# Patient Record
Sex: Male | Born: 1940 | Race: White | Hispanic: No | Marital: Married
Health system: Southern US, Community
[De-identification: ages and names within clinical notes are randomized; demographics above are authoritative.]

---

## 2001-08-31 ENCOUNTER — Ambulatory Visit (HOSPITAL_COMMUNITY): Admission: RE | Admit: 2001-08-31 | Discharge: 2001-08-31 | Payer: Self-pay | Admitting: Gastroenterology

## 2002-02-08 ENCOUNTER — Encounter: Payer: Self-pay | Admitting: *Deleted

## 2002-02-08 ENCOUNTER — Inpatient Hospital Stay (HOSPITAL_COMMUNITY): Admission: EM | Admit: 2002-02-08 | Discharge: 2002-02-10 | Payer: Self-pay | Admitting: *Deleted

## 2002-06-16 ENCOUNTER — Encounter: Payer: Self-pay | Admitting: Family Medicine

## 2002-06-16 ENCOUNTER — Encounter: Admission: RE | Admit: 2002-06-16 | Discharge: 2002-06-16 | Payer: Self-pay | Admitting: Family Medicine

## 2006-06-16 ENCOUNTER — Ambulatory Visit: Payer: Self-pay | Admitting: Gastroenterology

## 2006-07-02 ENCOUNTER — Ambulatory Visit: Payer: Self-pay | Admitting: Gastroenterology

## 2006-07-02 ENCOUNTER — Encounter (INDEPENDENT_AMBULATORY_CARE_PROVIDER_SITE_OTHER): Payer: Self-pay | Admitting: *Deleted

## 2006-10-06 ENCOUNTER — Encounter: Admission: RE | Admit: 2006-10-06 | Discharge: 2006-10-06 | Payer: Self-pay | Admitting: Family Medicine

## 2006-10-17 ENCOUNTER — Encounter: Admission: RE | Admit: 2006-10-17 | Discharge: 2006-10-17 | Payer: Self-pay | Admitting: Family Medicine

## 2006-12-23 ENCOUNTER — Encounter: Admission: RE | Admit: 2006-12-23 | Discharge: 2006-12-23 | Payer: Self-pay | Admitting: Family Medicine

## 2007-01-08 ENCOUNTER — Encounter: Admission: RE | Admit: 2007-01-08 | Discharge: 2007-01-08 | Payer: Self-pay | Admitting: Family Medicine

## 2007-01-29 ENCOUNTER — Inpatient Hospital Stay (HOSPITAL_COMMUNITY): Admission: RE | Admit: 2007-01-29 | Discharge: 2007-02-03 | Payer: Self-pay | Admitting: Neurosurgery

## 2007-01-29 ENCOUNTER — Ambulatory Visit: Payer: Self-pay | Admitting: Emergency Medicine

## 2007-03-02 ENCOUNTER — Ambulatory Visit (HOSPITAL_BASED_OUTPATIENT_CLINIC_OR_DEPARTMENT_OTHER): Admission: RE | Admit: 2007-03-02 | Discharge: 2007-03-02 | Payer: Self-pay | Admitting: Emergency Medicine

## 2007-03-10 ENCOUNTER — Ambulatory Visit: Payer: Self-pay | Admitting: Pulmonary Disease

## 2007-03-17 ENCOUNTER — Ambulatory Visit: Payer: Self-pay | Admitting: Pulmonary Disease

## 2007-09-15 IMAGING — CR DG ORBITS FOR FOREIGN BODY
1 series · 1 of 1 positions shown · non-contrast
Comparison: None.

ORBITS - 2 VIEW:

CLINICAL DATA: History of metal exposure. Patient for MRI

[view not recorded]
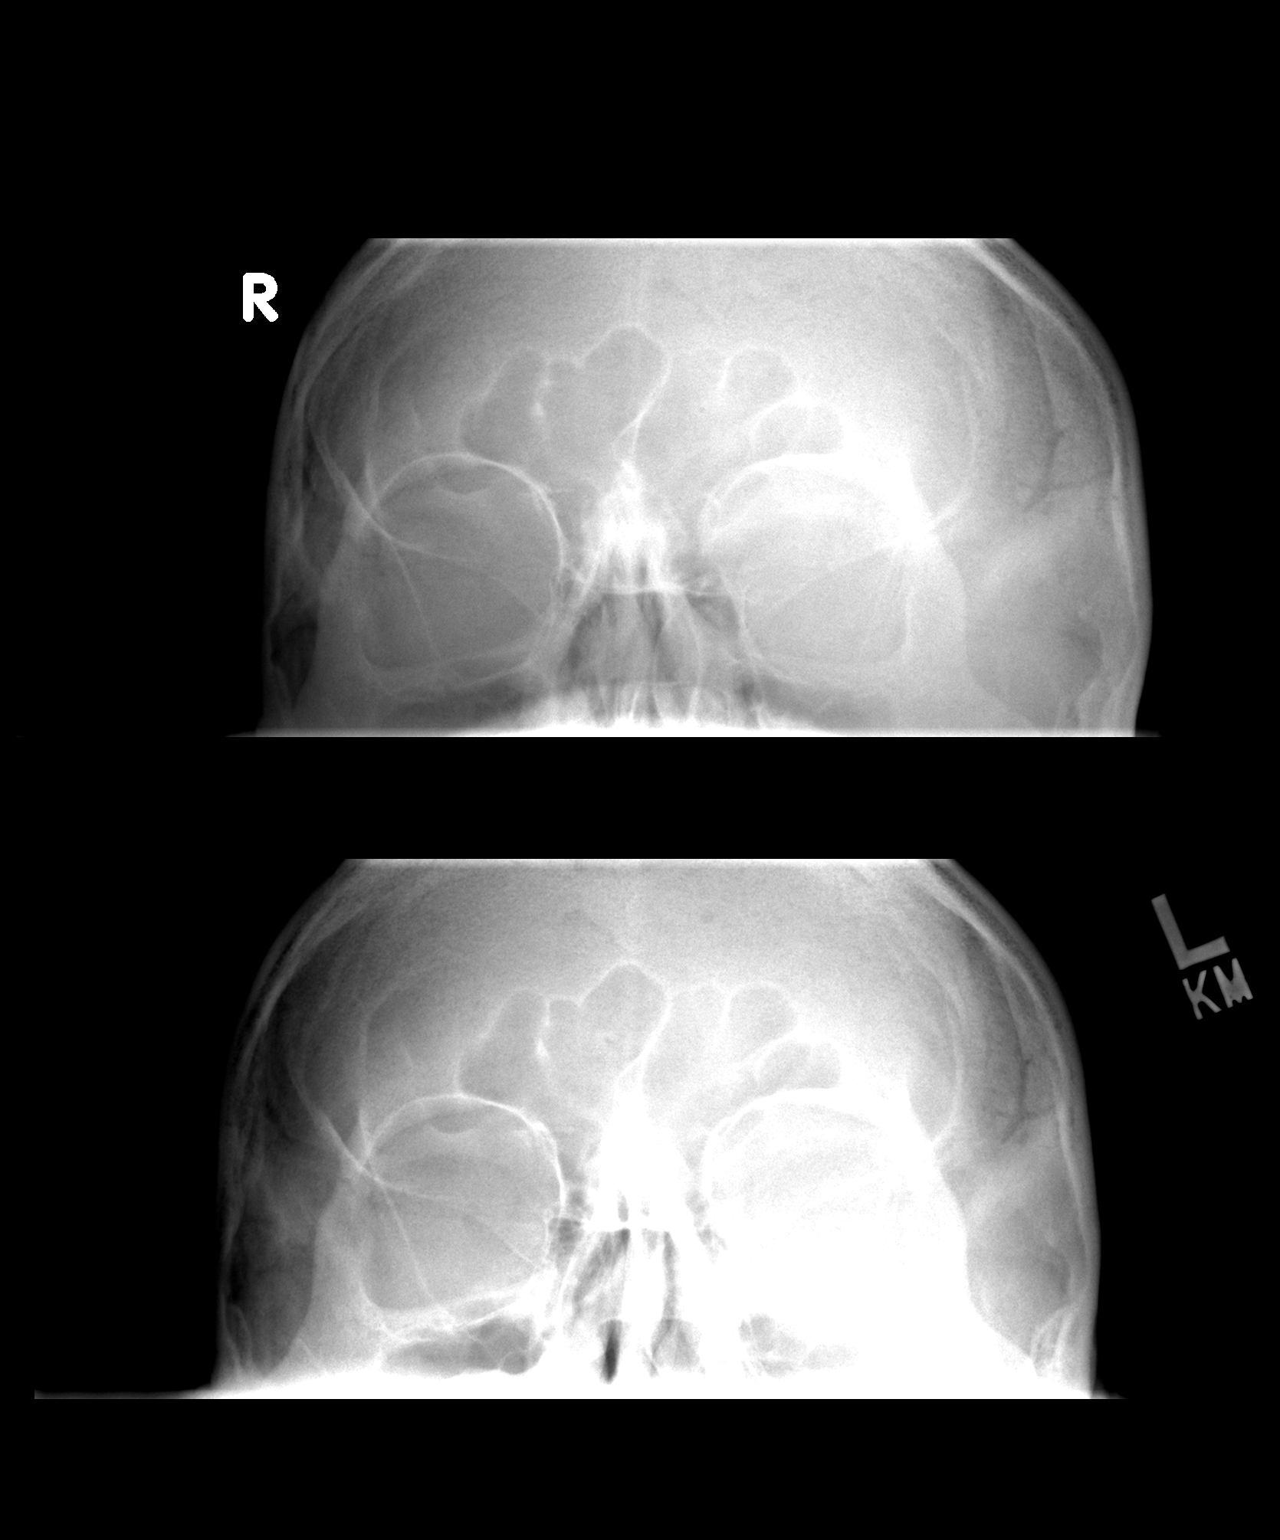

[1 of 1 positions shown; findings below may reference images not displayed]

FINDINGS: There is no evidence of metallic foreign body within the orbits. No
significant bone abnormality identified.
IMPRESSION: No evidence of metallic foreign body within the orbits.

## 2008-01-08 IMAGING — CR DG CERVICAL SPINE 2 OR 3 VIEWS
1 series · 1 of 1 positions shown · non-contrast
Comparison: none

CLINICAL DATA: Cervical kyphosis with C3-4 through C6-7 fusion. 
INTRAOPERATIVE LATERAL VIEWS OF THE CERVICAL SPINE SUBMITTED FOR REVIEW AFTER SURGERY:

[view not recorded]
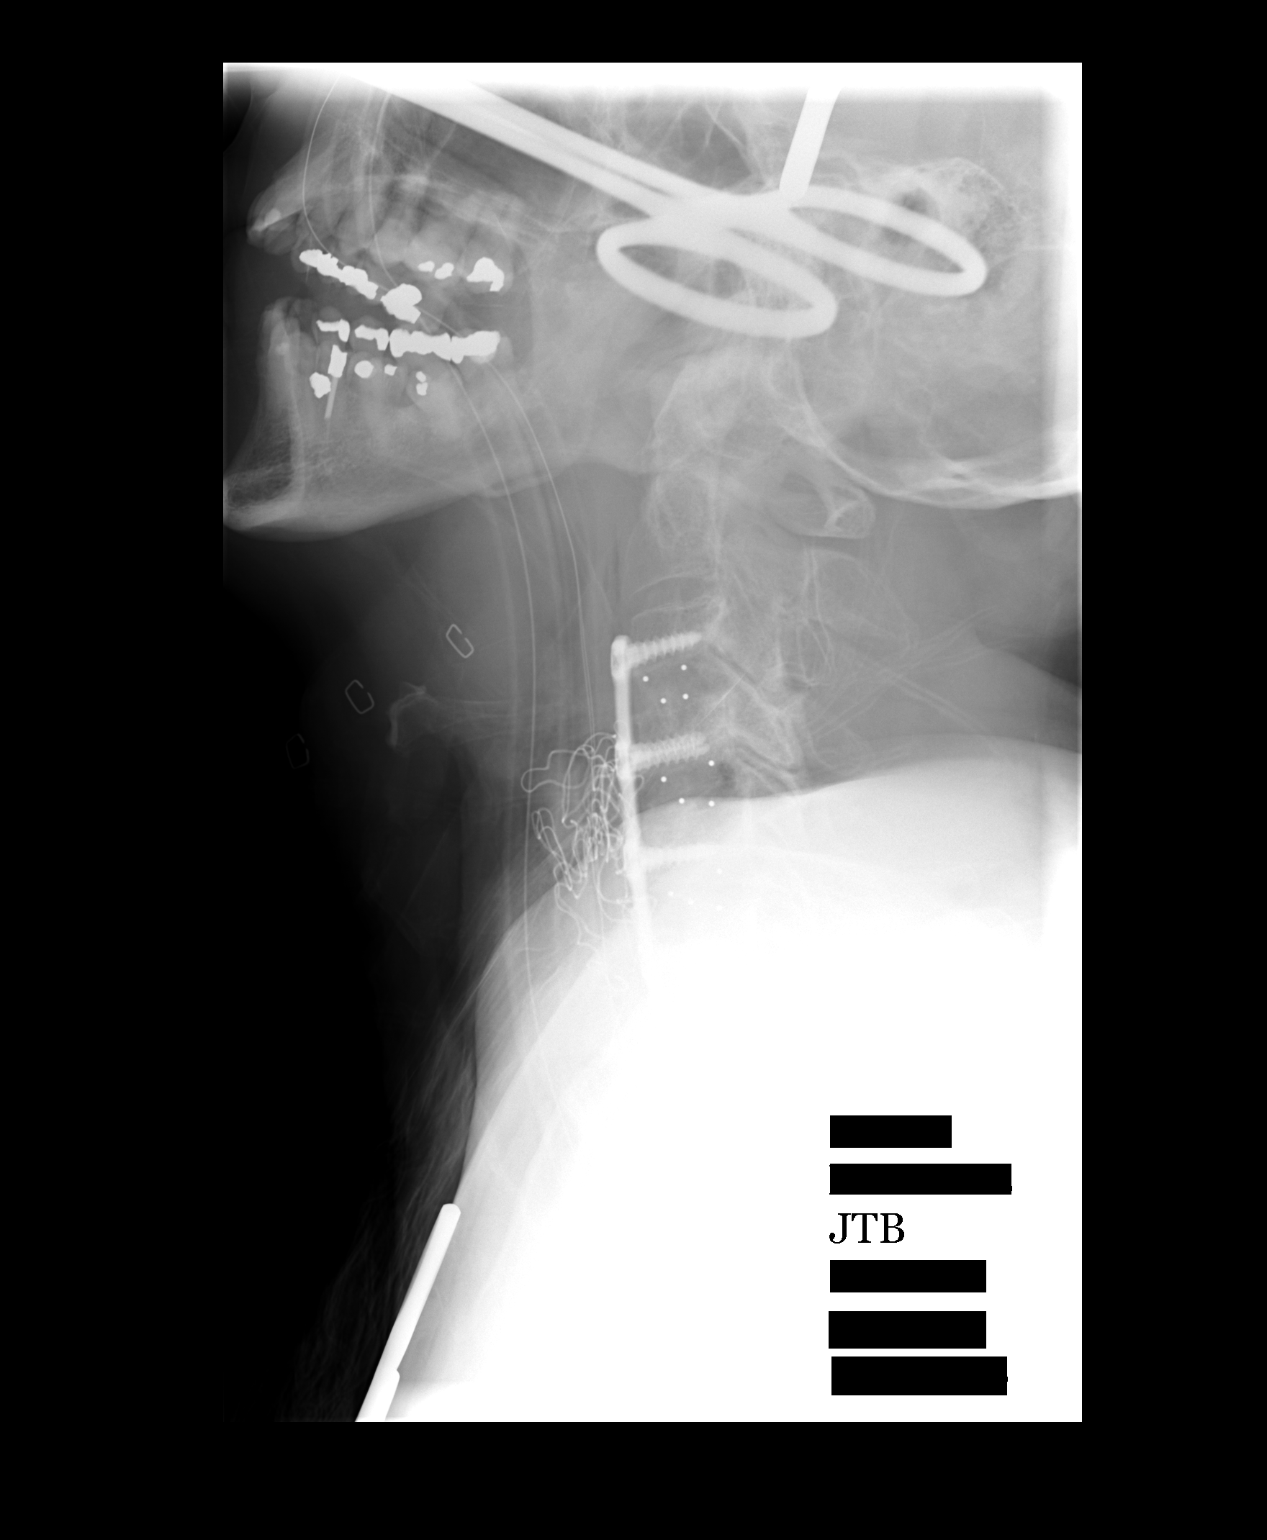

[1 of 1 positions shown; findings below may reference images not displayed]

FINDINGS: First film 01/29/07 at [DATE] a.m. reveals a metallic probe at the C4-5 level.  
Second film 01/29/07 at [DATE] a.m. reveals fusion C-3 to what appears to be C-7.  The upper aspect of the fusion is visualized and appears unremarkable.  Lower aspect is not visualized secondary to overlying shoulders.  This can be evaluated on followup.  Surgical sponge is in place.
IMPRESSION: Fusion C-3 to C-7.

## 2008-09-21 ENCOUNTER — Encounter: Admission: RE | Admit: 2008-09-21 | Discharge: 2008-09-21 | Payer: Self-pay | Admitting: Neurosurgery

## 2008-09-27 ENCOUNTER — Inpatient Hospital Stay (HOSPITAL_COMMUNITY): Admission: RE | Admit: 2008-09-27 | Discharge: 2008-09-29 | Payer: Self-pay | Admitting: Neurosurgery

## 2008-10-21 LAB — CONVERTED CEMR LAB
Chloride: 104 meq/L
Cholesterol: 221 mg/dL
HDL: 50 mg/dL
Triglycerides: 105 mg/dL

## 2008-12-15 ENCOUNTER — Ambulatory Visit: Payer: Self-pay | Admitting: Family Medicine

## 2008-12-15 DIAGNOSIS — E785 Hyperlipidemia, unspecified: Secondary | ICD-10-CM

## 2008-12-15 DIAGNOSIS — L821 Other seborrheic keratosis: Secondary | ICD-10-CM

## 2008-12-15 DIAGNOSIS — D649 Anemia, unspecified: Secondary | ICD-10-CM | POA: Insufficient documentation

## 2008-12-15 DIAGNOSIS — L570 Actinic keratosis: Secondary | ICD-10-CM | POA: Insufficient documentation

## 2008-12-15 LAB — CONVERTED CEMR LAB
Cholesterol, target level: 200 mg/dL
HDL goal, serum: 40 mg/dL
LDL Goal: 160 mg/dL

## 2008-12-19 ENCOUNTER — Encounter: Payer: Self-pay | Admitting: Family Medicine

## 2008-12-20 ENCOUNTER — Telehealth: Payer: Self-pay | Admitting: Family Medicine

## 2008-12-28 ENCOUNTER — Telehealth: Payer: Self-pay | Admitting: Family Medicine

## 2009-02-02 ENCOUNTER — Telehealth: Payer: Self-pay | Admitting: Family Medicine

## 2009-02-03 ENCOUNTER — Encounter: Payer: Self-pay | Admitting: Family Medicine

## 2009-05-30 ENCOUNTER — Encounter (INDEPENDENT_AMBULATORY_CARE_PROVIDER_SITE_OTHER): Payer: Self-pay | Admitting: *Deleted

## 2009-07-03 ENCOUNTER — Ambulatory Visit: Payer: Self-pay | Admitting: Gastroenterology

## 2009-07-13 ENCOUNTER — Encounter: Payer: Self-pay | Admitting: Gastroenterology

## 2009-07-13 ENCOUNTER — Ambulatory Visit: Payer: Self-pay | Admitting: Gastroenterology

## 2009-07-16 ENCOUNTER — Encounter: Payer: Self-pay | Admitting: Gastroenterology

## 2009-09-06 IMAGING — RF DG LUMBAR SPINE 2-3V
1 series · 1 of 1 positions shown · non-contrast
Comparison: MRI 09/21/2008.

CLINICAL DATA: L3 kyphoplasty.

LUMBAR SPINE - 2-3 VIEW

[Series 1: run · 1 of 1 slices shown]
[im 1/1]
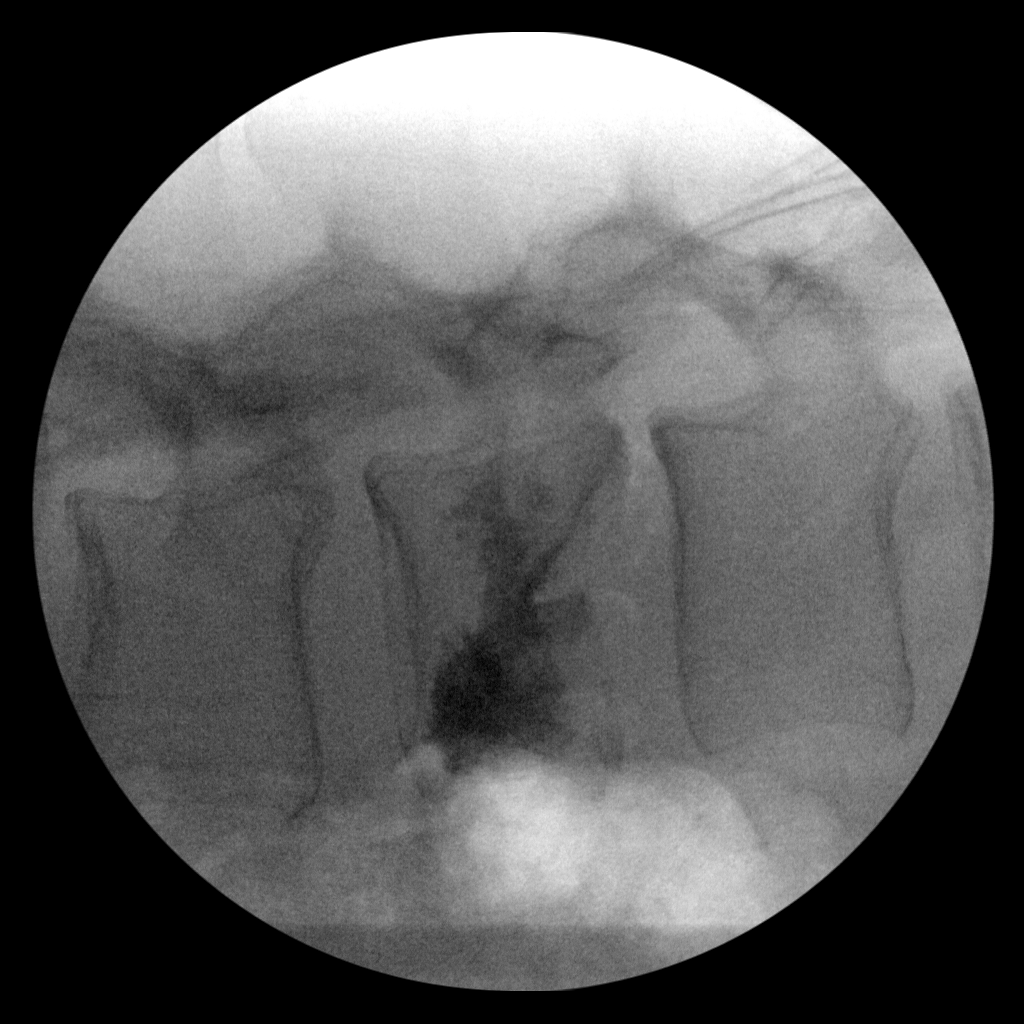

[1 of 1 positions shown; findings below may reference images not displayed]

FINDINGS: The patient is status post L3 kyphoplasty.  Cement
appears be contained within the vertebral body.  The configuration
of lumbar spine is otherwise unchanged.
IMPRESSION: 1.  Status post intraoperative kyphoplasty without radiographic
evidence for complication.

## 2010-11-11 ENCOUNTER — Encounter: Payer: Self-pay | Admitting: Family Medicine

## 2011-03-05 NOTE — Op Note (Signed)
NAME:  Jacob Vang, Jacob Vang NO.:  [id number]   MEDICAL RECORD NO.:  [id number]          PATIENT TYPE:  INP   LOCATION:  3107                         FACILITY:  [facility]   PHYSICIAN:  Danae Orleans. Venetia Maxon, M.D.  DATE OF BIRTH:  Nov 26, 1940   DATE OF PROCEDURE:  09/28/2008  DATE OF DISCHARGE:                               OPERATIVE REPORT   PREOPERATIVE DIAGNOSIS:  Paraparesis.   POSTOPERATIVE DIAGNOSIS:  Paraparesis.   PROCEDURE:  Wound exploration with evacuation of hematoma.   SURGEON:  Danae Orleans. Venetia Maxon, MD   ASSISTANT:  Hilda Lias, MD   ANESTHESIA:  General endotracheal anesthesia.   ESTIMATED BLOOD LOSS:  Minimal.   COMPLICATIONS:  None.   DISPOSITION:  Recovery Neuro ICU.   INDICATIONS:  [name] is a 70 year old man who had undergone  lumbar decompressive laminectomy for spinal stenosis.  Several hours  following surgery, he developed weakness in his legs with ascending  numbness.  He was taken back on an emergent basis to the operating room  for exploration of his wound.   PROCEDURE:  [name] was brought to the operating room.  Following  satisfactory uncomplicated induction of general endotracheal anesthesia,  he was placed in a prone position on the operating table on a Wilson  frame.  His previous incision was prepped and draped and reopened.  Fascia was opened.  There was evidence of hematoma within the  laminectomy defect.  Hematoma was removed.  This resulted in significant  decompression of the thecal sac.  The wound was then extensively  irrigated.  Soft tissues were inspected and cauterized any bleeding  points, a medium Hemovac drain was placed.  The wound was then closed  with interrupted Vicryl sutures.  The patient was taken to the recovery.  He tolerated the procedure well.      Danae Orleans. Venetia Maxon, M.D.  Electronically Signed     JDS/MEDQ  D:  09/28/2008  T:  09/28/2008  Job:  [id number]

## 2011-03-05 NOTE — Assessment & Plan Note (Signed)
[facility]                             PULMONARY OFFICE NOTE   NAME:Jacob Vang, Jacob Vang                      MRN:          [id number]  DATE:03/17/2007                            DOB:          1941-10-09    I met Jacob Vang today for evaluation of sleep apnea.   He was originally seen by my partner, Dr. Levy Pupa in the hospital  after he had undergone his cervical spine surgery.  At that time, he was  noted to have oxygen desaturations while he was asleep, and it was  assumed he has sleep apnea.  He had undergone an overnight polysomnogram  Mar 02, 2007, and this showed an overall apnea/hypopneic index of 33  with an oxygen saturation nadir88% consistent with severe sleep apnea.  He also was noted to have frequent PVCs and an increase in his periodic  limb movement index of 25.  However, his periodic limb movement index  with arousals was 8.8.   He says he has had problems with his sleep for quite some time.  He does  snore while he is asleep and has been told he stops breathing while he  asleep as well.  He will occasionally wake up with a gasping sensation  as well as hearing himself snore.  He has trouble sleeping on his back.  He tends to breathe through his mouth at night and gets a dry mouth as  well.  He attempts to go to bed between 10:30 and 11:00 at night,  although sometimes he will fall asleep prior to this while watching TV.  He often times will need to take a nap after dinner as well.  Once he  falls asleep, he wakes up once or twice to use the bathroom but then has  no trouble falling back asleep after that.  He gets up at 7:00 in the  morning but still feels quite tired.  There is no history of sleep-  walking or sleep-talking.  He denies any history of nightmares or night  terrors.  He also denies any symptoms of sleep hallucinations, sleep  paralysis, or cataplexy, and there is no history of restless leg  syndrome.  He is not  currently using anything to help him fall asleep at  night, although he occasionally will use Ny-Quil due to nasal  congestion.  He drinks 1 cup of coffee in the morning and a glass of tea  for lunch and dinner.   PAST MEDICAL HISTORY:  1. Hypertension.  2. Elevated cholesterol.  3. Reflux.   PAST SURGICAL HISTORY:  1. Cervical spine surgery in April 2008.  2. Surgery for a deviated septum in 1980.   CURRENT MEDICATIONS:  1. Nexium 40 mg daily as needed.  2. Gemfibrozil 600 mg b.i.d.  3. Aspirin 325 mg daily.  4. Triamterine/hydrochlorothiazide combination 37.5/25 mg once daily.   He has no known drug allergies.   SOCIAL HISTORY:  He is married.  He is a retired Emergency planning/management officer.  There  is no history of tobacco use.  He has occasional alcohol use.  FAMILY HISTORY:  Significant in his father who had emphysema and snored,  his mother who had heart disease.   REVIEW OF SYSTEMS:  He complains of occasional reflux, particularly at  night.   PHYSICAL EXAMINATION:  VITAL SIGNS:  He is 6 feet tall, 173 pounds,  temperature 98.1, blood pressure 134/82, heart rate 57, oxygen  saturation is 96% on room air.  HEENT:  Pupils reactive.  There is no sign of tenderness.  No nasal  discharge.  He has a Mallampati's II airway with decreased AP diameter  of the oropharynx and a low-lying soft palate.  There is no  lymphadenopathy, no thyromegaly.  HEART:  S1, S2, regular rate and rhythm.  CHEST:  Clear to auscultation.  ABDOMEN:  Soft, nontender, positive bowel sounds.  EXTREMITIES:  There is no edema, cyanosis, or clubbing.  NEUROLOGIC:  No focal deficits are appreciated.   IMPRESSION:  Severe obstructive sleep apnea as demonstrated by an  apnea/hypopnea of 33 and oxygen saturation of 80%.  I have reviewed the  results of his sleep study with him.  I discussed the diagnosis of sleep  apnea including the adverse consequences of untreated sleep apnea which  were detailed as increased risk  of hypertension, coronary artery  disease, cerebrovascular disease, diabetes, and arrhythmias.  I  discussed with him the importance of diet, exercise and maintaining his  weight as well as the avoidance of alcohol and sedatives.  Driving  precautions were reviewed with him as well.  I had discussed the various  treatment options for his sleep apnea including CPAP therapy, oral  appliance, and surgical intervention.  Given the severity of his sleep  apnea, I had recommended that he undergo CPAP therapy.  I will therefore  make arrangements for him to undergo an auto-CPAP titration study for 2  weeks.  I would then plan on following up with him in approximately 6-8  weeks.  I have advised him that if he has difficulty with this, then he  would need to undergo an in-lab CPAP titration study.     Coralyn Helling, MD  Electronically Signed    VS/MedQ  DD: 03/17/2007  DT: 03/17/2007  Job #: [id number]   cc:   Mosetta Putt, M.D.  Danae Orleans. Venetia Maxon, M.D.  Leslye Peer, MD

## 2011-03-05 NOTE — Op Note (Signed)
NAME:  Jacob Vang, Jacob Vang NO.:  [id number]   MEDICAL RECORD NO.:  [id number]          PATIENT TYPE:  INP   LOCATION:  3107                         FACILITY:  [facility]   PHYSICIAN:  Danae Orleans. Venetia Maxon, M.D.  DATE OF BIRTH:  [birth year]   DATE OF PROCEDURE:  DATE OF DISCHARGE:                               OPERATIVE REPORT   PREOPERATIVE DIAGNOSES:  L2-3 stenosis, scoliosis, and L3 compression  fracture with spondylosis, degenerative disease, and lumbar  radiculopathy.   POSTOPERATIVE DIAGNOSES:  L2-3 stenosis, scoliosis, and L3 compression  fracture with spondylosis, degenerative disease, and lumbar  radiculopathy.   PROCEDURE:  1. Bilateral laminal foraminotomies L2-3.  2. Microdissection.  3. L3 kyphoplasty.   SURGEON:  Danae Orleans. Venetia Maxon, MD   ASSISTANT:  Hilda Lias, MD   ANESTHESIA:  General endotracheal anesthesia.   ESTIMATED BLOOD LOSS:  Minimal.   COMPLICATIONS:  None.   DISPOSITION:  Recovery.   INDICATIONS:  Jacob Vang is a 70 year old man with severe spinal  stenosis at L2-3 with an L3 compression fracture.  It was elected to  take him to surgery for decompression at the L2-3 level with an L3  kyphoplasty.   PROCEDURE:  Jacob Vang was brought to the operating room.  Following  satisfactory and uncomplicated induction of general endotracheal  anesthesia, placement of intravenous lines, the patient was placed in a  prone position on the Wilson frame.  Initially, the kyphoplasty  procedure was performed and AP and lateral fluoroscopy was utilized.  Using bilateral transpedicular approach at L3, the inflatable bone tamps  were inserted into the L3 vertebra.  Count was confirmed with AP and  lateral fluoroscopy.  There was a large depression within the center of  the vertebra and it was difficult to get good filling within the  vertebra without reaching the superior endplate with bone cement;  however, I was able to fill the anterior cortex of the  vertebra and to  have minimal extravasation into the disk space.  After filling was  performed with AP and lateral fluoroscopy and it was felt that there was  adequate fill within the vertebra and was with minimal extravasation of  the disk space, it was elected to terminate that portion of the  procedure.  The stab incisions were closed with 3-0 Vicryl interrupted  stitches.  At this point, the patient was redraped.  C-arms were taken  out and back was reprepped.  The area of planned incision was  infiltrated with lidocaine.  Incision was made in the midline and  carried through.  The lumbodorsal fascia was incised bilaterally sharply  at the L2-3 level.  Intraoperative x-ray with marker probe at L2-3  confirmed correct orientation.  A hemi semi-laminotomy of L2 was  performed bilaterally with high-speed drill and completed with Kerrison  rongeurs.  The microscope was brought into the field.  The thickened  ligamentous tissue was removed and the spinal canal dura was  decompressed, as was the lateral recesses and there was bulging disk,  but it was felt that the neural elements appeared to be well-  decompressed  with bilateral microdissection and decompression of lateral  recesses.  Thickened ligamentous tissue was also removed and it was felt  that the spinal canal was well decompressed.  Hemostasis was assured.  The wound was irrigated and then bathed in Depo-Medrol and fentanyl.  The self-retaining retractor was removed.  The lumbodorsal fascia was  closed with 0 Vicryl sutures, subcutaneous tissues were reapproximated  with 2-0 Vicryl interrupted inverted sutures, and skin edges were  reapproximated with interrupted 0 Vicryl subcuticular stitch.  The wound  was dressed with Dermabond.  The patient was extubated in the operating  room, taken to the recovery room in stable and satisfactory condition  having tolerated the operation well.  All counts were correct at the end  of the  case.      Danae Orleans. Venetia Maxon, M.D.  Electronically Signed     JDS/MEDQ  D:  09/27/2008  T:  09/28/2008  Job:  [id number]

## 2011-03-05 NOTE — Procedures (Signed)
NAME:  Jacob Vang, Jacob Vang NO.:  [id number]   MEDICAL RECORD NO.:  [id number]          PATIENT TYPE:  OUT   LOCATION:  SLEEP CENTER                 FACILITY:  [facility]   PHYSICIAN:  Barbaraann Share, MD,FCCPDATE OF BIRTH:  [birth year]04[birth year]1938   DATE OF STUDY:  03/02/2007                            NOCTURNAL POLYSOMNOGRAM   REFERRING PHYSICIAN:   INDICATION FOR STUDY:  Hypersomnia with sleep apnea.   EPWORTH SLEEPINESS SCORE:  Seven.   SLEEP ARCHITECTURE:  The patient had a total sleep time of 329 minutes  with decreased slow wave sleep as well as REM.  Sleep onset latency was  normal, and REM onset was at the upper limits of normal.  Sleep  efficiency was mildly decreased at 88%.   RESPIRATORY DATA:  The patient was found to have 23 hypopneas and 157  apneas for an apnea/hypopnea index of 33 events per hour.  The events  were not positional, and there was moderate snoring noted throughout.   OXYGEN DATA:  There was difficulty with the O2 saturation probe  throughout the night, however, the lowest O2 saturation appeared to be  88% with the patient's obstructive events.   CARDIAC DATA:  Very frequent PVCs noted throughout.   MOVEMENT-PARASOMNIA:  The patient was found to have 138 leg jerks with  8.8 per hour resulting in arousal or awakening.  These all occurred  primarily in the first half of the night.   IMPRESSION/RECOMMENDATION:  1. Moderate obstructive sleep apnea/hypopnea syndrome with an      apnea/hypopnea index of 33 events per hour and O2 desaturation as      low as 88%.  Treatment for this degree of sleep apnea can include      weight loss alone if applicable, upper airway surgery, oral      appliance, and also continuous positive airway pressure.  Clinical      correlation is suggested.  2. Very frequent premature ventricular contractions noted throughout.      There appeared to be no runs of ventricular tachycardia.  3. Large numbers of leg jerks with what  appears to be significant      sleep disruption.  However, the majority of these events occurred      during the first half of the night, and more than likely they are      related to the patient's      sleep disordered breathing.  Clinical correlation is suggested if      the patient fails to respond to optimal treatment of his      obstructive sleep apnea.      Barbaraann Share, MD,FCCP  Diplomate, American Board of Sleep  Medicine     [facility]/MEDQ  D:  03/05/2007 16:15:01  T:  03/05/2007 20:03:13  Job:  [id number]

## 2011-03-08 NOTE — Discharge Summary (Signed)
[facility]  Patient:    Jacob Vang, Jacob Vang Visit Number: [id number] MRN: [id number]          Service Type: TRA Location: 5000 [id number] Attending Physician:  Trauma, Md Dictated by:   Shawn Rayburn, P.A. Admit Date:  02/08/2002 Discharge Date: 02/10/2002   CC:         [place]. Daphine Deutscher, M.D.  Carolyne Fiscal, M.D.   Discharge Summary  ADMITTING TRAUMA SURGEON:  [place]. Daphine Deutscher, M.D.  CONSULTANTS:  None.  PRIMARY PHYSICIAN:  Carolyne Fiscal, M.D.  DISCHARGE DIAGNOSES: 1. Right rib fractures secondary to fall. 2. Right hemothorax.  HISTORY OF PRESENT ILLNESS:  This is a 70 year old Caucasian male who was helping a friend repair a jack when he fell approximately three to five feet down through 2 x 6 joists and struck his back and side on the right.  He was seen at [facility] in [place] and x-rays were obtained and showed right rib fractures.  The patient had some question of subcutaneous emphysema and was sent to the emergency room.  On presentation here, he appeared to be in mild respiratory distress due to pain on the right side and splinting on the right side.  He had no subcutaneous emphysema but was tender to palpation over the right lateral chest wall.  Hemoglobin on admission was 13.2, white blood cell count 10.6.  The patient was hemodynamically stable on presentation. Oxygen saturation on 3 liters was 99%.  Temperature was 99.1.  HOSPITAL COURSE:  The patient was admitted.  CT scan was done of the patients abdomen and lower chest and revealed a right hemothorax, no pneumothorax, no free air, no intra-abdominal fluid, no solid organ injury appreciated.  The patient had evidence of sigmoid colonic diverticular disease without evidence for diverticulitis.  He had bilateral renal cysts with a 6 cm in size mildly complicated right upper pole renal cyst noted with rim calcification. Otherwise the scan was negative.  Observation  continued. Initially the patient had significant nausea and vomiting.  It was questioned that he might have a pancreatic or bowel injury.  He was continued on observation for an additional night.  He had no further nausea or vomiting.  He remained hemodynamically stable.  He had no abdominal pain.  He was discharged home in stable and improved condition on February 10, 2002 with plans for trauma clinic follow up on February 16, 2002.  DISCHARGE MEDICATIONS:  Vicodin one to two p.o. q.4-6h. p.r.n. pain.  FOLLOW-UP:  Trauma clinic on February 16, 2002.  The patient needs to call and confirm this appointment. Dictated by:   Shawn Rayburn, P.A. Attending Physician:  Trauma, Md DD:  02/10/02 TD:  02/10/02 Job: [id number]

## 2011-03-08 NOTE — Assessment & Plan Note (Signed)
[facility]                           GASTROENTEROLOGY OFFICE NOTE   NAME:Jacob Vang, Jacob Vang                      MRN:          [id number]  DATE:06/16/2006                            DOB:          03-21-1941    REFERRING PHYSICIAN:  Dr. Mosetta Putt.   REASON FOR REFERRAL:  Chronic GERD.   HISTORY OF PRESENT ILLNESS:  Jacob Vang is a very nice 70 year old white male  referred through the courtesy of Dr. Mosetta Putt.  He has had problems  reflux, indigestion and belching for greater than 10-15 years but with the  past several years he has used Prilosec which was initially effective but  has become ineffective.  He was changed to Nexium by Dr. Duaine Dredge and his  symptomatic control improved, however he still has a tickle in his throat,  occasional hoarseness, post prandial belching, night time reflux and  regurgitation and occasional mild solid food dysphagia.  He has no weight  loss, odynophagia, abdominal pain, rectal pain, melena, hematochezia, change  in bowel habits or change in stool caliber.  There is no family history of  colon cancer, colon polyps or inflammatory bowel disease.   PAST MEDICAL HISTORY:  1. Hyperlipidemia.  2. Allergies.  3. 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  4. GERD.   PAST SURGICAL HISTORY:  Negative.   CURRENT MEDICATIONS:  1. Nexium 40 mg q. day.  2. Gemfibrozil 600 mg q. day.  3. Aspirin 325 mg q. day.   ALLERGIES:  Medication allergies none known.   SOCIAL HISTORY AND REVIEW OF SYSTEMS:  Per diagnostic evaluation form.   PHYSICAL EXAMINATION:  GENERAL:  Well-developed, well-nourished white male  in no acute distress.  VITAL SIGNS:  Height 6 feet.  Weight 177 pounds  Blood pressure  138/82.  Pulse 60 and regular.  HEENT:  Anicteric sclera, oropharynx clear.  CHEST:  Clear to auscultation bilaterally.  CARDIAC:  Regular rate and rhythm without murmurs appreciated.  ABDOMEN:  Soft.  non-tender, non-distended.  Normal active bowel sounds.  No  palpable organomegaly, masses, or hernias.  RECTAL:  Examination deferred to time of colonoscopy.  Exam by Dr. Duaine Dredge  in February was unremarkable.  EXTREMITIES:  No clubbing, cyanosis, or edema.  NEUROLOGICAL:  Alert and oriented x3.  Grossly non-focal.   ASSESSMENT/PLAN:  1. Gastroesophageal reflux disease with some LPR  symptoms and mild solid      food dysphagia.  Control is fair to good.  Continue Nexium 40 mg p.o.      q. a.m.  Begin standard anti-reflux measures including the use of 4      inch bed blocks.  Risks, benefits and alternatives to upper endoscopy      and possible biopsy and possible dilation discussed with the patient.      He consents to proceed.  This will be scheduled electively.  Rule out      Barrett's esophagus, erosive esophagitis and strictures.  Consider      increasing his proton pump inhibitor to twice a day.  2. Colorectal cancer screening.  He states he had a  colonoscopy by Dr.      Randa Evens in the past.  We will attempt to obtain those records.      Colorectal cancer screening is due.  Risk, benefits and alternatives to      colonoscopy with possible biopsies, possible polypectomy discussed with      the patient and he consents to proceed.  This will be scheduled      electively at the time of his upper endoscopy.  Will change aspirin to      325 mg every other day for the seven days prior to the procedure.                                   Venita Lick. Pleas Koch., MD, Clementeen Graham   MTS/MedQ  DD:  06/16/2006  DT:  06/16/2006  Job #:  [id number]   cc:   Mosetta Putt, MD

## 2011-03-08 NOTE — Op Note (Signed)
NAME:  Jacob Vang NO.:  [id number]   MEDICAL RECORD NO.:  [id number]          PATIENT TYPE:  INP   LOCATION:  3172                         FACILITY:  [facility]   PHYSICIAN:  Danae Orleans. Venetia Maxon, M.D.  DATE OF BIRTH:  21-Jun-1941   DATE OF PROCEDURE:  01/29/2007  DATE OF DISCHARGE:                               OPERATIVE REPORT   PREOPERATIVE DIAGNOSIS:  Herniated cervical disk with spondylosis,  myelopathy, kyphosis, stenosis and radiculopathy C3-C7 levels.   POSTOPERATIVE DIAGNOSIS:  Herniated cervical disk with spondylosis,  myelopathy, kyphosis, stenosis and radiculopathy C3-C7 levels.   PROCEDURE:  Anterior cervical decompression and fusion C3-4, C4-5, C5-6  and C6-7 level, PEEK interbody cages, morcellized bone autograft and  Osteocel with anterior cervical plate.   SURGEON:  Danae Orleans. Venetia Maxon, M.D.   ASSISTANTS:  1. Clydene Fake, M.D.  2. Georgiann Cocker, RN.   ANESTHESIA:  General endotracheal anesthesia.   ESTIMATED BLOOD LOSS:  100 mL   COMPLICATIONS:  None.   DISPOSITION:  Recovery.   INDICATIONS:  Jacob Vang is a 70 year old  retired Emergency planning/management officer  with cervical myelopathy with sensory level just below the neck and with  erectile dysfunction, hyperreflexia and weakness in his upper  extremities.  He has cervical kyphosis, cervical disk degeneration and  cervical stenosis.  It was elected to take him to surgery for anterior  cervical decompression and fusion C3-4, C4-5, C5-6 and C6-7 levels with  PEEK interbody cages and the anterior cervical plate.   SURGEON:  Danae Orleans. Venetia Maxon, M.D.   PROCEDURE:  Mr. Deshler was brought to the operating room.  Following  satisfactory and uncomplicated induction of general endotracheal  anesthesia and placement of intravenous lines and Foley catheter, he was  placed in supine position on the operating table.  His  neck was placed  in slight extension.  He was placed in 10 pounds of halter traction.  His  anterior neck was then prepped and draped in the usual sterile  fashion.  A longitudinal incision was made after infiltrating skin and  subcutaneous tissues with local lidocaine and carried through platysma  layer.  Blunt dissection was performed, keeping the carotid sheath  lateral and trachea and esophagus medial, exposing the anterior cervical  spine.  A bent spinal needle was placed through what was felt to be the  C4-5 level and this was confirmed on intraoperative x-ray.  Subsequently, the longus colli muscles were taken down from the anterior  cervical spine from C3-C7 levels using electrocautery, Key elevator and  a Shadowline retractor was used to facilitate exposure.  Interspaces at  each of these levels were then incised with a 15 blade and highly  degenerated disk material was removed, large ventral spurs were removed  and saved for later use with bone grafting and each disk space was  sequentially evacuated of disk material.  Subsequently, disk space  spreaders were placed and the endplates were decorticated at each level  using high-speed drill.  Suction trap was used to retain bone fragments  from the drilling of the endplates.  Subsequently, initially starting at  the C3-4 level, the microscope was brought into field.  Using high-power  microscopic visualization, the uncinate spurs were removed and  hypertrophied ligament was removed resulting in decompression of the  central spinal canal and spinal cord dura.  Both the C4 nerve roots were  widely decompressed.  Hemostasis was assured and after trial sizing  small 7 mm PEEK interbody cage was selected, packed with morcellized  bone autograft and also Osteocel inserted in the interspace and  countersunk appropriately.  Attention was then turned to the C4-5 level  where similar decompression was performed and both neural foramina were  decompressed as was central spinal cord dura.  At this level a 8 mm PEEK  interbody cage  was selected and packed with morselized bone autograft  and Osteocel inserted in the interspace and countersunk appropriately.  Attention was then turned to the C6-7 level where similar decompression  was performed, both C7 nerve roots were widely decompressed.  Hemostasis  was assured.  A 8 mm PEEK interbody cage was selected, packed with  morcellized bone autograft and Osteocel inserted in the interspace and  countersunk appropriately.  Attention was then turned to the C5-6 level  where similar decompression was performed and after trial sizing, a 7 mm  cage was selected, packed with bone graft and inserted in the interspace  and countersunk appropriately.  A 72 mm trestle anterior cervical plate  was then affixed to the anterior cervical spine with 14 mm variable  angle screws two at C3, two at C4, two at C5, two at C6 and two at C7.  All screws had excellent purchase.  Locking mechanisms were engaged.  Final x-ray demonstrated well-positioned interbody grafts in anterior  cervical plate.  The lower aspect of the construct was not visualized in  this radiograph.  Traction weight was removed prior to placing the plate  and the wound was then irrigated with bacitracin saline.  Soft tissues  were inspected and found to be in good repair.  A #7 JP drain was  inserted through separate stab incision and anchored with a nylon  stitch.  The wound was then closed with 3-0 Vicryl sutures in the  platysma layer and 3-0 Vicryl interrupted inverted sutures to  reapproximate the skin edges.  The wound was dressed with Benzoin, Steri-  Strips, Telfa gauze and tape.  The patient was extubated in the  operating room and taken to the recovery room in stable satisfactory  condition having tolerated his operation well.  Counts were correct at  the end of the case.      Danae Orleans. Venetia Maxon, M.D.  Electronically Signed     JDS/MEDQ  D:  01/29/2007  T:  01/29/2007  Job:  [id number]

## 2011-03-08 NOTE — Discharge Summary (Signed)
NAME:  [name], [name] NO.:  [id number]   MEDICAL RECORD NO.:  [id number]          PATIENT TYPE:  INP   LOCATION:  3010                         FACILITY:  [facility]   PHYSICIAN:  Danae Orleans. Venetia Maxon, M.D.  DATE OF BIRTH:  [birth year]   DATE OF ADMISSION:  01/29/2007  DATE OF DISCHARGE:  02/03/2007                               DISCHARGE SUMMARY   REASON FOR ADMISSION:  1. Cervical disk herniation with myelopathy.  2. Cervical spondylosis with myelopathy.  3. Kyphosis  4. Dysphagia.  5. Hypertension.  6. Esophageal reflux.  7. Obstructive sleep apnea.  8. Hyperlipidemia.   FINAL DIAGNOSES:  1. Cervical disk herniation with myelopathy.  2. Cervical spondylosis with myelopathy.  3. Kyphosis  4. Dysphagia.  5. Hypertension.  6. Esophageal reflux.  7. Obstructive sleep apnea.  8. Hyperlipidemia.   HISTORY OF ILLNESS AND HOSPITAL COURSE:  Jacob Vang is a 70 year old  man with severe cervical spondylitic myelopathy and cord compression who  was admitted to the hospital with bilateral upper extremity weakness.  He was admission to the hospital on the same day of procedure basis and  underwent anterior cervical decompression and fusion from C3 to C7  levels.  Postoperatively he was observed in the ACU and he had a drain  which was subsequently discontinued.  The patient was transferred to the  regular floor.  He complained of some dysphagia on the 11th, and was  doing well.  His drain was discontinued on the 12th.  He was transferred  to the regular floor.  He had a problem that night with difficulty  catching his breath and felt that he could not breathe.  However, he had  95% O2 saturations and was observed and did not appear to have any  significant stridor or significant dysphagia.  His wound appeared soft  without any evidence of obstructive hematoma.  On further discussion  with the family and the patient, it was felt that this was most likely  secondary to  obstructive sleep apnea, and the patient gave a history of  very loud snoring and his wife having to frequently jostle him to wake  him up at night.  I felt it was a combination of this premorbid status  with sedating medications and recent neck surgery that made this become  a more significant problem.  I then called Dr. Delton Coombes from critical care  medicine who evaluated the patient and agreed with this diagnosis and  felt that he did have obstructive sleep apnea, and felt that he would be  okay to be discharged home and arranged for him to have a sleep study  through the [facility] clinic on Mar 17, 2007 at 2:10 p.m.  The patient was  doing better on the 14th and was discharged home at that point with  instructions to follow up in the office in 3 weeks and to keep his  previously scheduled sleep study appointment.   DISCHARGE MEDICATIONS:  1. Decadron taper, which was initiated with his dysphagia and      complained of difficulty catching his breath.  2. Percocet  for pain.      Danae Orleans. Venetia Maxon, M.D.  Electronically Signed     JDS/MEDQ  D:  03/19/2007  T:  03/19/2007  Job:  [id number]

## 2011-03-08 NOTE — Consult Note (Signed)
NAME:  Jacob Vang, ZERBE NO.:  [id number]   MEDICAL RECORD NO.:  [id number]          PATIENT TYPE:  INP   LOCATION:  3010                         FACILITY:  [facility]   PHYSICIAN:  Leslye Peer, MD    DATE OF BIRTH:  April 06, 1941   DATE OF CONSULTATION:  02/02/2007  DATE OF DISCHARGE:                                 CONSULTATION   REASON FOR CONSULTATION:  I have been asked by Dr. Maeola Harman to  evaluate [name] for suspected sleep disorder breathing.   BRIEF HISTORY:  [name] is a pleasant 70 year old gentleman with  hypertension, hyperlipidemia, GERD, and significant cervical  spondylopathy and compression with peripheral symptoms.  He was admitted  for cervical decompression by Dr. Venetia Maxon and is now postoperative day #6  status post cervical decompression and fusion.  His neurological  symptoms have improved postoperatively.  [name] describes episodic  awakenings and alarming and scary episodes in which he is able to move  air and that he feels like he is smothering.  These episodes always  happen with sleep.  On awakening, he recovers and while he is awake he  does not have any breathing difficulties.  He has had episodes that were  less pronounced for many months prior to this presentation.  His wife  states that he snores.  She has witnessed apneic events.  She also has  seen leg movements.  He has jerked himself awake on multiple occasions  with associated panic.  He complains of daytime sleepiness and  nonrestorative sleep.  He does not have morning headaches.  He takes  unintentional naps in the afternoons and will fall asleep if he is not  engaged in activity.  Per his wife's report during this hospitalization,  desaturations were caught while sleeping on monitor postoperatively.  He  denies any aspiration or dysphagia symptoms.   PAST MEDICAL HISTORY:  1. Cervical spondylopathy, as described above and in Dr. Fredrich Birks      notes.  2.  Hypertension.  3. Hypercholesterolemia.  4. GERD.   ALLERGIES:  NO KNOWN DRUG ALLERGIES.   SOCIAL HISTORY:  The patient is a nonsmoker.  He occasionally uses  alcohol.  He does not use any other substances.  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   FAMILY HISTORY:  Significant for hypertension.   CURRENT MEDICATIONS:  1. Welchol 625 mg t.i.d.  2. Protonix 80 mg daily.  3. Trampoline HCTZ 37.5/25 mg daily.  4. Colace 100 mg b.i.d.  5. Hydromorphone p.r.n.  6. Cefazolin 1 g q.8h.  7. Oxycodone p.r.n.  8. Valium 5 mg q.6h. p.r.n.  9. Zofran p.r.n.  10.Senna p.r.n.  11.Cepacol p.r.n.  12.Chloraseptic spray p.r.n.  13.Maalox p.r.n.  14.Ambien p.r.n.  15.Dilaudid 1-2 mg q.1h. p.r.n.  16.Phenergan p.r.n.   PHYSICAL EXAMINATION:  GENERAL:  This is a pleasant well-appearing man  who is sitting up in the chair on room air.  He is in a soft C-collar.  VITAL SIGNS:  Temperature 98.2, blood pressure 157/90, heart rate 82,  respiratory rate 20,  SPO2 97% on 2 liters and then again 97% when  changed to room air.  HEENT:  He has absolutely no stridor any upper airway noise.  LUNGS:  Decreased at the bases, but he does not have any wheezing or  crackles.  HEART:  Regular rate and rhythm without murmur.  ABDOMEN:  Benign.  EXTREMITIES:  No cyanosis, clubbing or edema.  NEUROLOGIC:  Grossly nonfocal exam.  He is alert and oriented and  interacts appropriately.  His sensation and his lower extremity  discomfort are better postoperatively.   LABORATORY DATA:  No labs are available.   Chest x-ray from January 23, 2007 showed mid to upper spine compression  deformity and some old right-sided rib fractures.  There were no  infiltrates.   IMPRESSION:  70 year old man with multiple signs and symptoms that are  consistent with obstructive sleep apnea, possibly also with a  superimposed periodic limb movement disorder.  I believe that his sleep  apnea  has been accentuated during this hospitalization probably by his  need for narcotics and benzodiazepines and also possibly due to his  upper airway irritation from his endotracheal intubation and his neck  surgery.  He is currently quite comfortable and stable sitting up in  chair on room air.  I have reassured the patient regarding my suspicion  for sleep apnea and that will hopefully alleviate some of his panic  associated with these episodes.  I will arrange for an outpatient  polysomnogram to be performed after he is no longer in a C-collar.  I  will have him follow up with my colleague, Dr. Coralyn Helling, after his  polysomnogram to plan for appropriate therapy.  He states that he has  difficulty wearing a mask on his face, and it may be difficult for him  to tolerate continuous positive airway pressure.  For this reason, I  will ask him to see Dr. Craige Cotta to explore all the possible options for  therapy.  Plans prior to my arrival had been made for him to be  transferred to step-down given his episode of difficult breathing.  I do  not believe this will be necessary as I feel we have an adequate  explanation for this episode, and he now looks perfectly stable.  I will  discuss this with Dr. Venetia Maxon and will defer to his transfer if he is  agreeable.      Leslye Peer, MD  Electronically Signed     RSB/MEDQ  D:  02/02/2007  T:  02/02/2007  Job:  [id number]   cc:   Danae Orleans. Venetia Maxon, M.D.  Mosetta Putt, M.D.

## 2011-03-08 NOTE — Assessment & Plan Note (Signed)
[facility]                             PULMONARY OFFICE NOTE   NAME:ALLENEfton, Thomley                      MRN:          [id number]  DATE:04/08/2007                            DOB:          03/24/1941    I received information from Mr. Theissen's home care company that he has  refused to use CPAP. I attempted to contact him to have him schedule a  followup appointment so we could discuss this further and possibly even  review alternative treatments for his sleep apnea. He was quite  reluctant to this and said that he would call me in the future if he  were to change his mind.     Coralyn Helling, MD  Electronically Signed    VS/MedQ  DD: 04/08/2007  DT: 04/09/2007  Job #: [id number]   cc:   Leslye Peer, MD  Mosetta Putt, M.D.  Danae Orleans. Venetia Maxon, M.D.

## 2011-03-08 NOTE — Procedures (Signed)
[facility]  Patient:    Jacob Vang, Jacob Vang Visit Number: [id number] MRN: [id number]          Service Type: Attending:  Llana Aliment. Randa Evens, M.D. Dictated by:   Llana Aliment. Randa Evens, M.D. Proc. Date: 08/31/01   CC:         Carolyne Fiscal, M.D.   Procedure Report  PROCEDURE:  Colonoscopy.  MEDICATIONS:  Fentanyl 75 mcg, Versed 4.5 mg IV.  SCOPE:  Olympus adult video colonoscope.  INDICATIONS:  Strong family history of colonic neoplasia.  Father died of colon cancer.  Mother had colon polyps.  DESCRIPTION OF PROCEDURE:  The procedure had been explained to the patient and consent obtained.  With the patient in the left lateral decubitus, the Olympus adult video colonoscope was inserted and advanced under direct visualization. The prep was excellent.  We were able to advance around to the cecum.  The ileocecal valve was identified.  The scope was withdrawn and the cecum, ascending colon, hepatic flexure, transverse colon, splenic flexure, descending colon and sigmoid colon were seen well upon removal.  No polyps or other lesions were seen.  No significant diverticular disease.  The scope was withdrawn, and the patient tolerated the procedure well.  ASSESSMENT:  No polyps or other lesions.  PLAN: Will recommend repeating in five years. Dictated by:   Llana Aliment. Randa Evens, M.D. Attending:  Llana Aliment. Randa Evens, M.D. DD:  08/31/01 TD:  09/01/01 Job: [id number]

## 2011-07-25 LAB — CBC
HCT: 37.6 % — ABNORMAL LOW (ref 39.0–52.0)
Hemoglobin: 12.4 g/dL — ABNORMAL LOW (ref 13.0–17.0)
MCHC: 32.9 g/dL (ref 30.0–36.0)
MCV: 90.7 fL (ref 78.0–100.0)
Platelets: 232 10*3/uL (ref 150–400)
RBC: 4.15 MIL/uL — ABNORMAL LOW (ref 4.22–5.81)
RDW: 13.1 % (ref 11.5–15.5)
WBC: 7.3 10*3/uL (ref 4.0–10.5)

## 2011-07-25 LAB — BASIC METABOLIC PANEL
Calcium: 9.7 mg/dL (ref 8.4–10.5)
Chloride: 105 mEq/L (ref 96–112)
Creatinine, Ser: 1.02 mg/dL (ref 0.4–1.5)
GFR calc Af Amer: >60 mL/min (ref >60)
GFR calc non Af Amer: >60 mL/min (ref >60)

## 2011-07-25 LAB — GLUCOSE, CAPILLARY: Glucose-Capillary: 142 mg/dL — ABNORMAL HIGH (ref 70–99)

## 2012-05-13 ENCOUNTER — Encounter: Payer: Self-pay | Admitting: Gastroenterology

## 2013-02-04 ENCOUNTER — Encounter: Payer: Self-pay | Admitting: Gastroenterology

## 2013-09-08 ENCOUNTER — Telehealth: Payer: Self-pay | Admitting: Cardiology

## 2014-02-22 NOTE — Telephone Encounter (Signed)
ERROR

## 2015-02-10 ENCOUNTER — Encounter: Payer: Self-pay | Admitting: Gastroenterology
